# Patient Record
Sex: Male | Born: 2000 | Race: White | Hispanic: No | Marital: Single | Smoking: Never smoker
Health system: Southern US, Community
[De-identification: ages and names within clinical notes are randomized; demographics above are authoritative.]

## PROBLEM LIST (undated history)

## (undated) DIAGNOSIS — S62109A Fracture of unspecified carpal bone, unspecified wrist, initial encounter for closed fracture: Secondary | ICD-10-CM

## (undated) DIAGNOSIS — T7840XA Allergy, unspecified, initial encounter: Secondary | ICD-10-CM

## (undated) HISTORY — PX: TONSILLECTOMY: SHX5217

## (undated) HISTORY — PX: ADENOIDECTOMY: SHX5191

## (undated) HISTORY — DX: Allergy, unspecified, initial encounter: T78.40XA

## (undated) HISTORY — DX: Fracture of unspecified carpal bone, unspecified wrist, initial encounter for closed fracture: S62.109A

## (undated) HISTORY — PX: TYMPANOSTOMY TUBE PLACEMENT: SHX32

## (undated) HISTORY — PX: INGUINAL HERNIA REPAIR: SUR1180

---
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
## 2020-08-28 ENCOUNTER — Other Ambulatory Visit: Payer: Self-pay

## 2020-08-28 ENCOUNTER — Encounter: Payer: Self-pay | Admitting: Family Medicine

## 2020-08-28 ENCOUNTER — Ambulatory Visit (INDEPENDENT_AMBULATORY_CARE_PROVIDER_SITE_OTHER): Admitting: Family Medicine

## 2020-08-28 VITALS — BP 122/82 | HR 107 | Temp 98.1°F | Ht 74.0 in | Wt 169.2 lb

## 2020-08-28 DIAGNOSIS — L03032 Cellulitis of left toe: Secondary | ICD-10-CM | POA: Diagnosis not present

## 2020-08-28 MED ORDER — CEPHALEXIN 500 MG PO CAPS: 500.0000 mg | ORAL_CAPSULE | Freq: Four times a day (QID) | ORAL | 0 refills | Status: AC

## 2020-08-28 NOTE — Progress Notes (Signed)
   Subjective:    Patient ID: Mellody Life III, male    DOB: 11-Jan-2001, 20 y.o.   MRN: [id number]  HPI Here for a painful left great toe. This began bothering him about 6 weeks ago. The toe is swollen and painful, and it oozes blood at times.    Review of Systems  Constitutional: Negative.   Respiratory: Negative.   Cardiovascular: Negative.   Skin: Positive for wound.       Objective:   Physical Exam Constitutional:      Appearance: Normal appearance.  Cardiovascular:     Rate and Rhythm: Normal rate and regular rhythm.     Pulses: Normal pulses.     Heart sounds: Normal heart sounds.  Pulmonary:     Effort: Pulmonary effort is normal.     Breath sounds: Normal breath sounds.  Skin:    Comments: The left great toe is red, warm, swollen, and very tender. There is some crusty dried blood along the lateral nail edge   Neurological:     Mental Status: He is alert.           Assessment & Plan:  Paronychia. Treat with Keflex. Soak in warm water and Epsom salts several times a day.  Gershon Crane, MD

## 2023-08-11 ENCOUNTER — Telehealth: Payer: Self-pay | Admitting: Family Medicine

## 2023-08-11 NOTE — Telephone Encounter (Signed)
LM to schedule appointment if needed.
